# Patient Record
Sex: Male | Born: 1985 | Hispanic: Yes | Smoking: Never smoker
Health system: Southern US, Community
[De-identification: ages and names within clinical notes are randomized; demographics above are authoritative.]

---

## 2015-02-17 ENCOUNTER — Encounter (HOSPITAL_COMMUNITY): Payer: Self-pay

## 2015-02-17 ENCOUNTER — Emergency Department (HOSPITAL_COMMUNITY)
Admission: EM | Admit: 2015-02-17 | Discharge: 2015-02-17 | Disposition: A | Discharge status: Home / Self Care | Payer: Self-pay | Attending: Emergency Medicine | Admitting: Emergency Medicine

## 2015-02-17 ENCOUNTER — Emergency Department (HOSPITAL_COMMUNITY): Payer: Self-pay

## 2015-02-17 DIAGNOSIS — Y9301 Activity, walking, marching and hiking: Secondary | ICD-10-CM | POA: Insufficient documentation

## 2015-02-17 DIAGNOSIS — S01511A Laceration without foreign body of lip, initial encounter: Secondary | ICD-10-CM | POA: Insufficient documentation

## 2015-02-17 DIAGNOSIS — Z23 Encounter for immunization: Secondary | ICD-10-CM | POA: Insufficient documentation

## 2015-02-17 DIAGNOSIS — Y9289 Other specified places as the place of occurrence of the external cause: Secondary | ICD-10-CM | POA: Insufficient documentation

## 2015-02-17 DIAGNOSIS — Y998 Other external cause status: Secondary | ICD-10-CM | POA: Insufficient documentation

## 2015-02-17 MED ORDER — TETANUS-DIPHTH-ACELL PERTUSSIS 5-2.5-18.5 LF-MCG/0.5 IM SUSP
0.5000 mL | Freq: Once | INTRAMUSCULAR | Status: AC
  Administered 2015-02-17: 0.5 mL via INTRAMUSCULAR
  Filled 2015-02-17: qty 0.5

## 2015-02-17 MED ORDER — LIDOCAINE-EPINEPHRINE (PF) 2 %-1:200000 IJ SOLN
10.0000 mL | Freq: Once | INTRAMUSCULAR | Status: AC
  Administered 2015-02-17: 10 mL
  Filled 2015-02-17: qty 20

## 2015-02-17 NOTE — ED Notes (Signed)
Pt attempting to climb out of bed multiple times. Cooperative when assisted back to bed
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
## 2015-02-17 NOTE — ED Notes (Signed)
Pt attempted to leave. Use translator to inform pt to stay in bed and that MD will suture his lip before discharge.

## 2015-02-17 NOTE — ED Notes (Signed)
Dr.Otter at bedside for suturing

## 2015-02-24 ENCOUNTER — Emergency Department (HOSPITAL_COMMUNITY)
Admission: EM | Admit: 2015-02-24 | Discharge: 2015-02-24 | Disposition: A | Discharge status: Home / Self Care | Payer: Self-pay | Attending: Emergency Medicine | Admitting: Emergency Medicine

## 2015-02-24 ENCOUNTER — Encounter (HOSPITAL_COMMUNITY): Payer: Self-pay | Admitting: Emergency Medicine

## 2015-02-24 DIAGNOSIS — Z4802 Encounter for removal of sutures: Secondary | ICD-10-CM | POA: Insufficient documentation

## 2015-02-24 NOTE — ED Notes (Signed)
Patient here for suture removal in upper left lip.  Area is approximated well, no swelling, no drainage.

## 2015-02-24 NOTE — ED Provider Notes (Signed)
CSN: [id number]     Arrival date & time 02/24/15  2118 History   First MD Initiated Contact with Patient 02/24/15 2148     Chief Complaint  Patient presents with  . Suture / Staple Removal     (Consider location/radiation/quality/duration/timing/severity/associated sxs/prior Treatment) HPI Jack Walsh is a 29 y.o. male who presents to the ED for suture removal from the upper lip left side. He denies any problems tonight.  History reviewed. No pertinent past medical history. History reviewed. No pertinent past surgical history. History reviewed. No pertinent family history. History  Substance Use Topics  . Smoking status: Never Smoker   . Smokeless tobacco: Not on file  . Alcohol Use: Yes    Review of Systems Negative except as stated in HPI   Allergies  Review of patient's allergies indicates no known allergies.  Home Medications   Prior to Admission medications   Not on File   BP 137/81 mmHg  Pulse 71  Temp(Src) 98.1 F (36.7 C) (Oral)  Resp 22  Ht  (1.727 m)  Wt 156 lb 4.8 oz (70.897 kg)  BMI 23.77 kg/m2  SpO2 99% Physical Exam  Constitutional: He is oriented to person, place, and time. He appears well-developed and well-nourished. No distress.  HENT:  Head:    Sutures in place upper lip left side without signs of infection.   Eyes: EOM are normal.  Neck: Neck supple.  Cardiovascular: Normal rate.   Pulmonary/Chest: Effort normal.  Musculoskeletal: Normal range of motion.  Neurological: He is alert and oriented to person, place, and time. No cranial nerve deficit.  Skin: Skin is warm and dry.  Psychiatric: He has a normal mood and affect. His behavior is normal.  Nursing note and vitals reviewed.   ED Course  Procedures  Suture removal without difficulty  MDM  30 y.o. male here for suture removal. He will return as needed for any problems.  Final diagnoses:  Visit for suture removal        Janne Napoleon, NP 02/25/15  4098  Pricilla Loveless, MD 02/27/15 [phone]

## 2015-02-24 NOTE — Discharge Instructions (Signed)
Remoción de la sutura, cuidados posteriores °(Suture Removal, Care After) °Siga estas instrucciones durante las próximas semanas. Estas indicaciones le proporcionan información general acerca de cómo deberá cuidarse después del procedimiento. El médico también podrá darle instrucciones más específicas. El tratamiento se ha planificado de acuerdo a las prácticas médicas actuales, pero a veces se producen problemas. Comuníquese con el médico si tiene algún problema o tiene dudas después del procedimiento. °QUÉ ESPERAR DESPUÉS DEL PROCEDIMIENTO °Después de que le retiren los puntos (suturas), es normal experimentar lo siguiente: °· Molestias e hinchazón en la zona de la herida. °· Leve enrojecimiento en la zona de la herida. °INSTRUCCIONES PARA EL CUIDADO EN EL HOGAR  °· Si tiene bandas adhesivas en la piel sobre la zona de la herida, no las retire. Se caerán solas en unos pocos días. Si las bandas adhesivas siguen en su lugar después de 14 días, entonces puede retirarlas. °· Cambie los apósitos (vendajes), al menos, una vez al día o según las instrucciones del médico. Si el vendaje se adhiere, remójelo con agua jabonosa tibia. °· Solo aplique un ungüento o crema según las indicaciones del médico. Si usa crema o ungüento, lave la zona con agua y jabón dos veces por día para quitarlo todo. Enjuague el jabón y seque suavemente la zona con una toalla limpia. °· Mantenga la herida limpia y seca. Si el vendaje se moja, se ensucia o tiene mal olor, cámbielo tan pronto como pueda. °· Continúe protegiendo la herida de lesiones. °· Use protector solar cuando salga al sol. Las cicatrices nuevas se broncean fácilmente. °SOLICITE ATENCIÓN MÉDICA SI: °· Aumenta el enrojecimiento, la hinchazón o el dolor en la zona afectada. °· Observa que sale pus de la herida. °· Tiene fiebre. °· Advierte un olor fétido que proviene de la herida o del vendaje. °· La herida se abre (los bordes se separan). °Document Released: 04/29/2005 Document  Revised: 05/10/2013 °ExitCare® Patient Information ©2015 ExitCare, LLC. This information is not intended to replace advice given to you by your health care provider. Make sure you discuss any questions you have with your health care provider. ° °
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
